# Patient Record
Sex: Female | Born: 1965 | Race: Asian | Hispanic: No | Marital: Married
Health system: Southern US, Community
[De-identification: ages and names within clinical notes are randomized; demographics above are authoritative.]

---

## 2003-06-02 ENCOUNTER — Other Ambulatory Visit: Admission: RE | Admit: 2003-06-02 | Discharge: 2003-06-02 | Payer: Self-pay | Admitting: Obstetrics and Gynecology

## 2003-11-24 ENCOUNTER — Inpatient Hospital Stay (HOSPITAL_COMMUNITY): Admission: AD | Admit: 2003-11-24 | Discharge: 2003-11-30 | Payer: Self-pay | Admitting: Obstetrics and Gynecology

## 2004-06-21 ENCOUNTER — Other Ambulatory Visit: Admission: RE | Admit: 2004-06-21 | Discharge: 2004-06-21 | Payer: Self-pay | Admitting: Obstetrics & Gynecology

## 2004-12-14 ENCOUNTER — Other Ambulatory Visit: Admission: RE | Admit: 2004-12-14 | Discharge: 2004-12-14 | Payer: Self-pay | Admitting: Obstetrics & Gynecology

## 2005-07-21 ENCOUNTER — Other Ambulatory Visit: Admission: RE | Admit: 2005-07-21 | Discharge: 2005-07-21 | Payer: Self-pay | Admitting: Obstetrics & Gynecology

## 2010-09-27 ENCOUNTER — Other Ambulatory Visit: Payer: Self-pay | Admitting: Obstetrics & Gynecology

## 2012-10-15 ENCOUNTER — Other Ambulatory Visit: Payer: Self-pay | Admitting: Obstetrics & Gynecology

## 2013-10-21 ENCOUNTER — Other Ambulatory Visit: Payer: Self-pay | Admitting: Obstetrics & Gynecology

## 2015-10-19 ENCOUNTER — Other Ambulatory Visit: Payer: Self-pay | Admitting: Obstetrics & Gynecology

## 2015-10-20 LAB — CYTOLOGY - PAP

## 2016-10-18 ENCOUNTER — Other Ambulatory Visit: Payer: Self-pay | Admitting: Obstetrics & Gynecology

## 2016-10-18 DIAGNOSIS — R928 Other abnormal and inconclusive findings on diagnostic imaging of breast: Secondary | ICD-10-CM

## 2016-10-31 ENCOUNTER — Ambulatory Visit
Admission: RE | Admit: 2016-10-31 | Discharge: 2016-10-31 | Disposition: A | Discharge status: Home / Self Care | Payer: BLUE CROSS/BLUE SHIELD | Source: Ambulatory Visit | Attending: Obstetrics & Gynecology | Admitting: Obstetrics & Gynecology

## 2016-10-31 DIAGNOSIS — R928 Other abnormal and inconclusive findings on diagnostic imaging of breast: Secondary | ICD-10-CM

## 2017-10-03 ENCOUNTER — Other Ambulatory Visit: Payer: Self-pay | Admitting: Obstetrics & Gynecology

## 2017-10-03 DIAGNOSIS — Z1231 Encounter for screening mammogram for malignant neoplasm of breast: Secondary | ICD-10-CM

## 2017-10-30 ENCOUNTER — Encounter: Payer: Self-pay | Admitting: Radiology

## 2017-10-30 ENCOUNTER — Ambulatory Visit
Admission: RE | Admit: 2017-10-30 | Discharge: 2017-10-30 | Disposition: A | Discharge status: Home / Self Care | Payer: BLUE CROSS/BLUE SHIELD | Source: Ambulatory Visit | Attending: Obstetrics & Gynecology | Admitting: Obstetrics & Gynecology

## 2017-10-30 DIAGNOSIS — Z1231 Encounter for screening mammogram for malignant neoplasm of breast: Secondary | ICD-10-CM

## 2018-09-18 ENCOUNTER — Other Ambulatory Visit: Payer: Self-pay | Admitting: Obstetrics & Gynecology

## 2018-09-18 DIAGNOSIS — Z1231 Encounter for screening mammogram for malignant neoplasm of breast: Secondary | ICD-10-CM

## 2018-11-05 ENCOUNTER — Ambulatory Visit: Payer: BLUE CROSS/BLUE SHIELD

## 2019-09-27 ENCOUNTER — Ambulatory Visit: Payer: Self-pay

## 2019-10-09 ENCOUNTER — Other Ambulatory Visit: Payer: Self-pay | Admitting: Obstetrics & Gynecology

## 2019-10-09 DIAGNOSIS — Z1231 Encounter for screening mammogram for malignant neoplasm of breast: Secondary | ICD-10-CM

## 2019-10-14 ENCOUNTER — Ambulatory Visit: Payer: Self-pay | Attending: Internal Medicine

## 2019-10-14 DIAGNOSIS — Z23 Encounter for immunization: Secondary | ICD-10-CM

## 2019-10-14 NOTE — Progress Notes (Signed)
   Covid-19 Vaccination Clinic  Name:  Katie Ramsey    MRN: [id number] DOB: Mar 30, 1966  10/14/2019  [name] was observed post Covid-19 immunization for 15 minutes without incident. She was provided with Vaccine Information Sheet and instruction to access the V-Safe system.   [name] was instructed to call 911 with any severe reactions post vaccine: Marland Kitchen Difficulty breathing  . Swelling of face and throat  . A fast heartbeat  . A bad rash all over body  . Dizziness and weakness   Immunizations Administered    Name Date Dose VIS Date Route   Pfizer COVID-19 Vaccine 10/14/2019 11:27 AM 0.3 mL 07/05/2019 Intramuscular   Manufacturer: [employer], [employer]   Lot: [id number]   NDC: [id number]

## 2019-10-28 IMAGING — MG DIGITAL SCREENING BILATERAL MAMMOGRAM WITH TOMO AND CAD
8 series · 9 of 24 positions shown · non-contrast
Comparison: Previous exam(s).

CLINICAL DATA: Screening.

EXAM:
DIGITAL SCREENING BILATERAL MAMMOGRAM WITH TOMO AND CAD

[L MLO synth-2D]
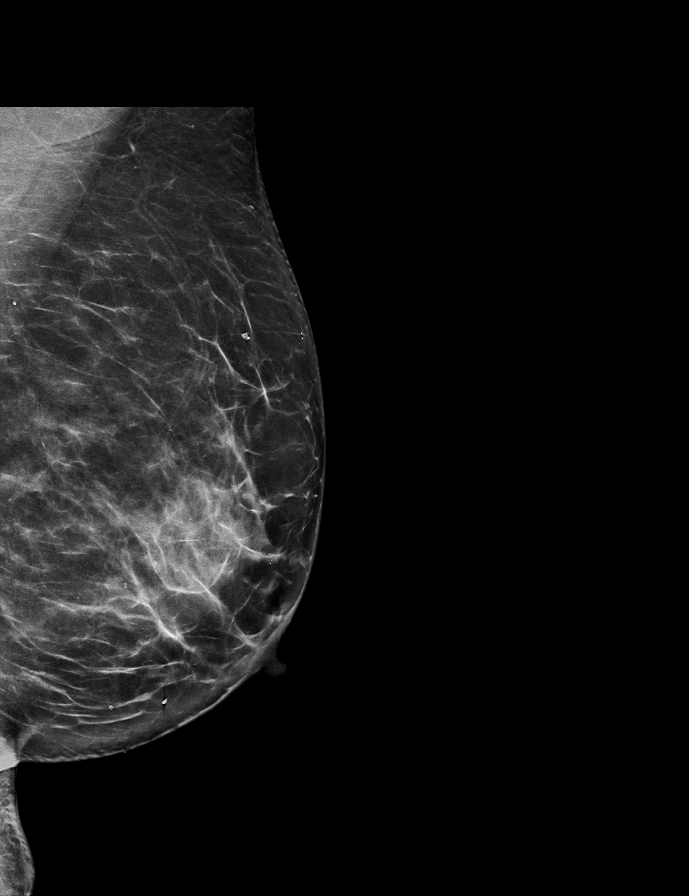

[R MLO synth-2D]
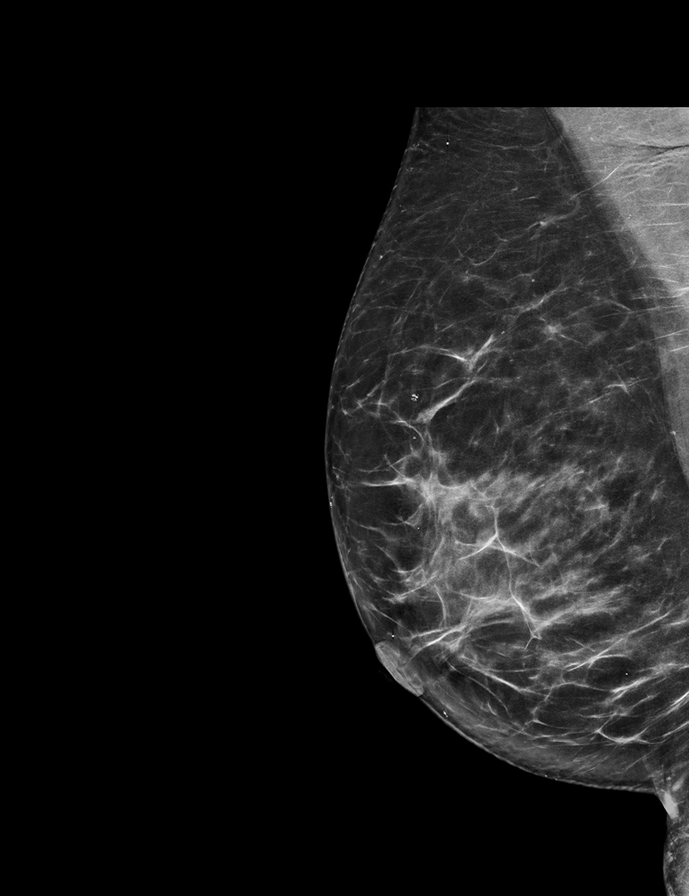

[R CC synth-2D]
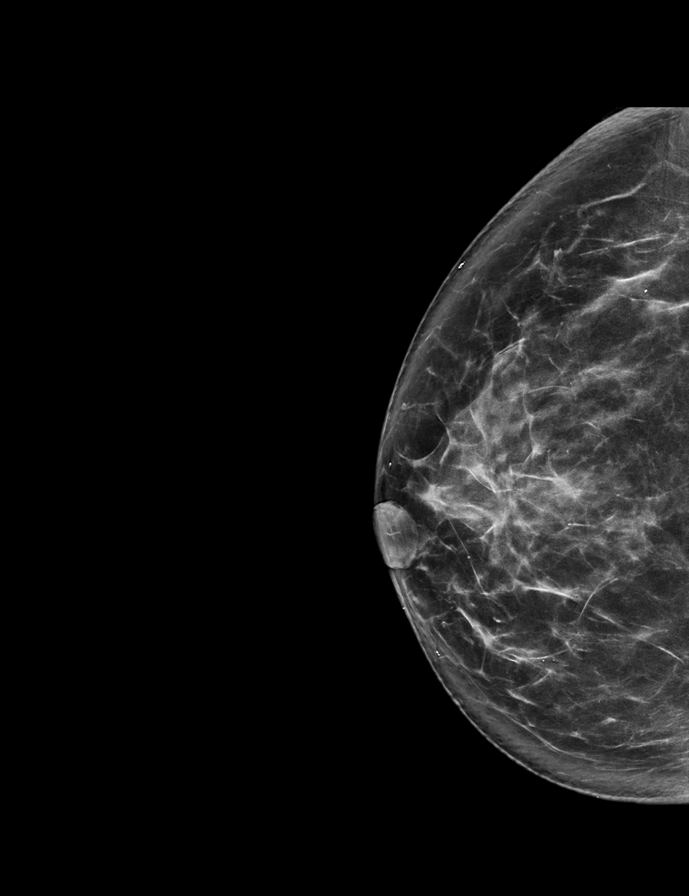

[L CC synth-2D]
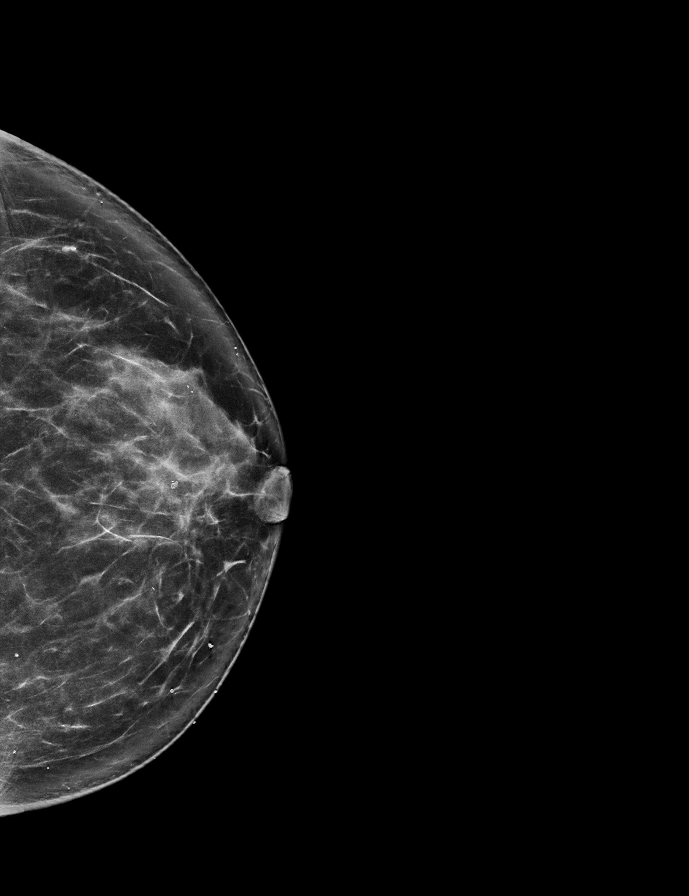

[L MLO tomo · 2 of 73 frames shown]
[frame 24/73]
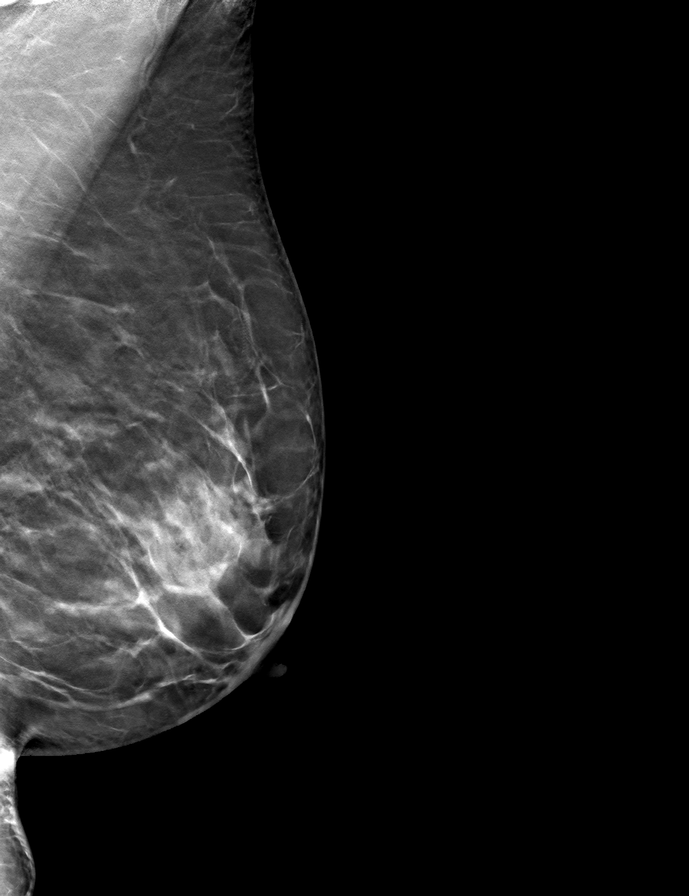
[frame 37/73]
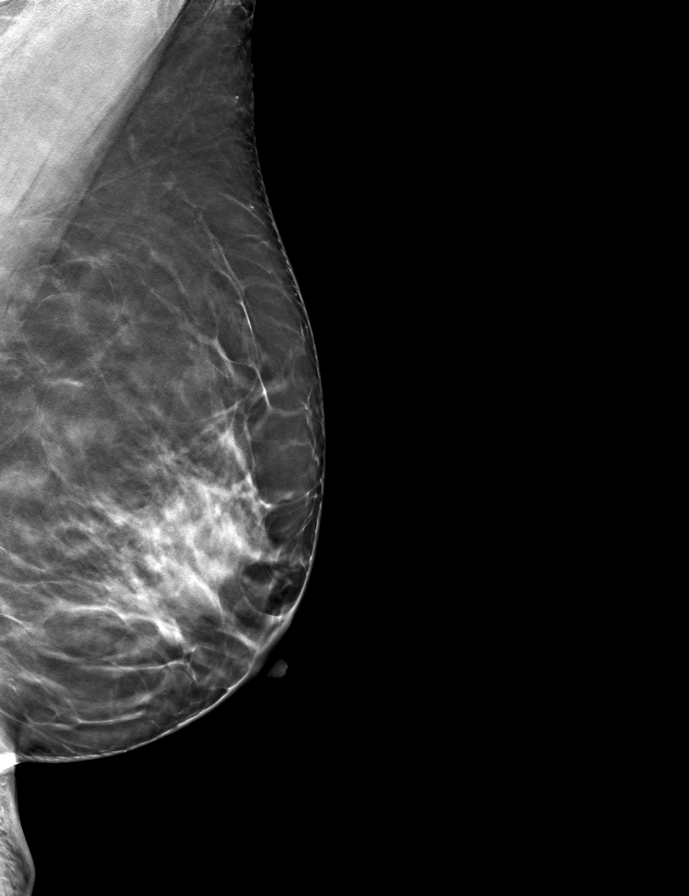

[R MLO tomo · tomo slice 38/75.0]
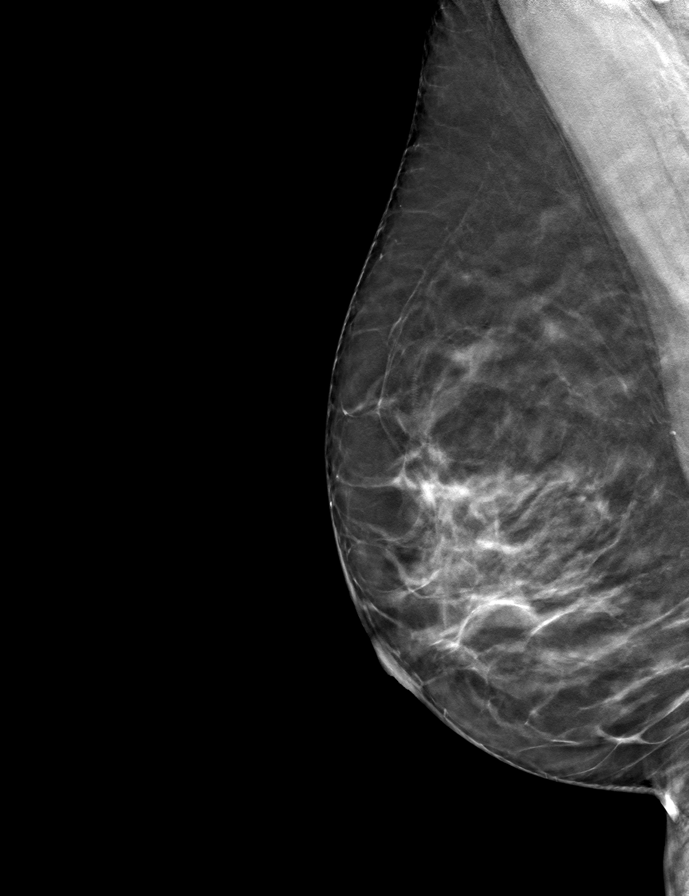

[R CC tomo · tomo slice 39/76.0]
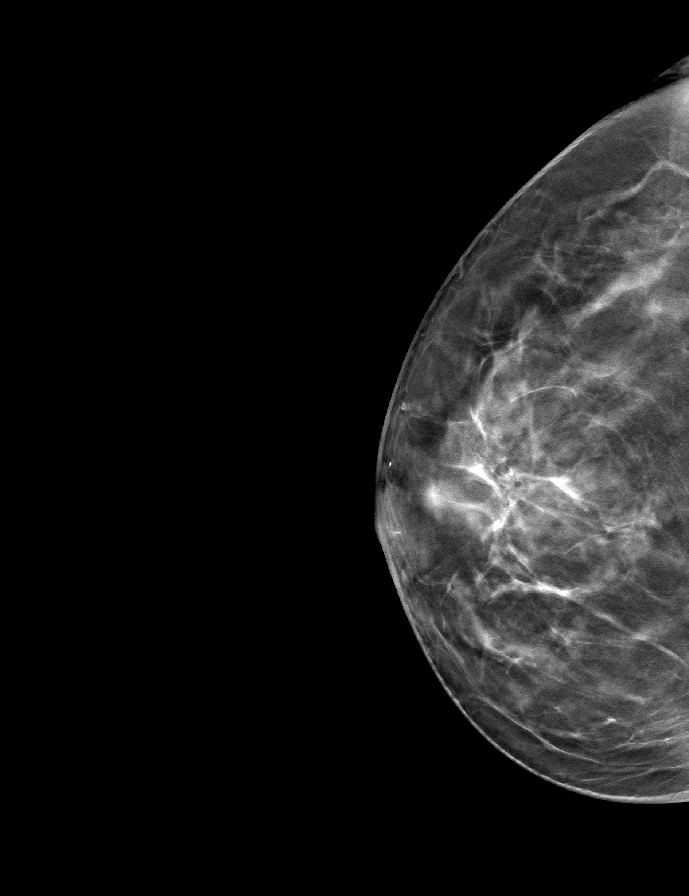

[L CC tomo · tomo slice 37/72.0]
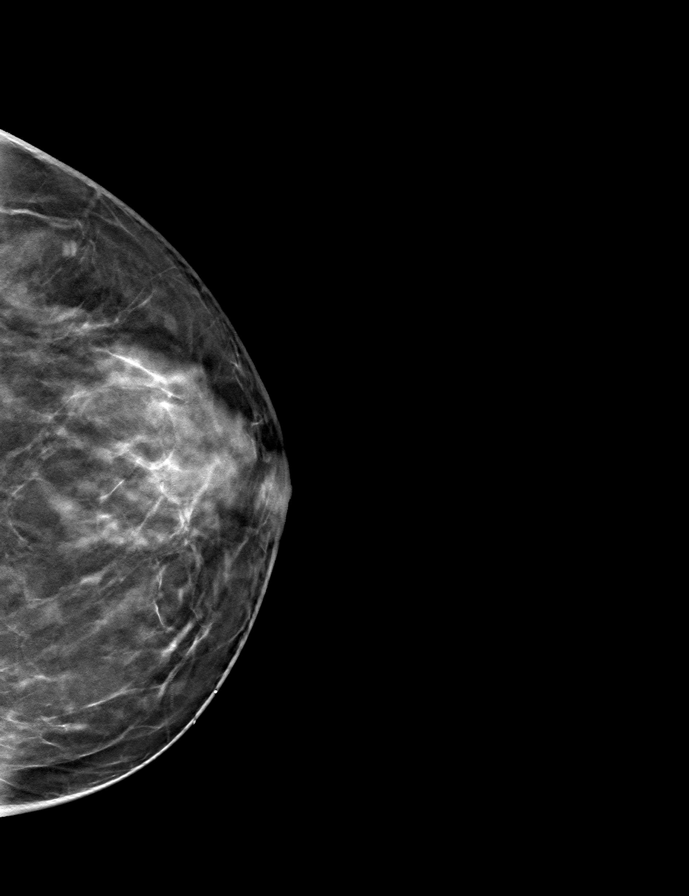

[9 of 24 positions shown; findings below may reference images not displayed]

ACR Breast Density Category c: The breast tissue is heterogeneously
dense, which may obscure small masses.
FINDINGS: There are no findings suspicious for malignancy. Images were
processed with CAD.
IMPRESSION: No mammographic evidence of malignancy. A result letter of this
screening mammogram will be mailed directly to the patient.

RECOMMENDATION:
Screening mammogram in one year. (Code:[id number])

BI-RADS CATEGORY  1: Negative.

## 2019-11-11 ENCOUNTER — Ambulatory Visit: Payer: Self-pay | Attending: Internal Medicine

## 2019-11-11 DIAGNOSIS — Z23 Encounter for immunization: Secondary | ICD-10-CM

## 2019-11-11 NOTE — Progress Notes (Signed)
   Covid-19 Vaccination Clinic  Name:  Katie Ramsey    MRN: [id number] DOB: 09-02-1965  11/11/2019  [name] was observed post Covid-19 immunization for 15 minutes without incident. She was provided with Vaccine Information Sheet and instruction to access the V-Safe system.   [name] was instructed to call 911 with any severe reactions post vaccine: Marland Kitchen Difficulty breathing  . Swelling of face and throat  . A fast heartbeat  . A bad rash all over body  . Dizziness and weakness   Immunizations Administered    Name Date Dose VIS Date Route   Pfizer COVID-19 Vaccine 11/11/2019 10:41 AM 0.3 mL 09/18/2018 Intramuscular   Manufacturer: [employer], [employer]   Lot: [id number]   NDC: [address]-7

## 2021-11-15 DIAGNOSIS — Z8742 Personal history of other diseases of the female genital tract: Secondary | ICD-10-CM | POA: Diagnosis not present

## 2021-11-15 DIAGNOSIS — E7800 Pure hypercholesterolemia, unspecified: Secondary | ICD-10-CM | POA: Diagnosis not present

## 2021-11-15 DIAGNOSIS — Z0000 Encounter for general adult medical examination without abnormal findings: Secondary | ICD-10-CM | POA: Diagnosis not present

## 2022-04-04 DIAGNOSIS — R7303 Prediabetes: Secondary | ICD-10-CM | POA: Diagnosis not present

## 2022-11-25 DIAGNOSIS — Z1231 Encounter for screening mammogram for malignant neoplasm of breast: Secondary | ICD-10-CM | POA: Diagnosis not present

## 2022-11-25 DIAGNOSIS — Z0000 Encounter for general adult medical examination without abnormal findings: Secondary | ICD-10-CM | POA: Diagnosis not present

## 2022-11-25 DIAGNOSIS — E7800 Pure hypercholesterolemia, unspecified: Secondary | ICD-10-CM | POA: Diagnosis not present

## 2022-11-25 DIAGNOSIS — R7303 Prediabetes: Secondary | ICD-10-CM | POA: Diagnosis not present

## 2022-11-25 DIAGNOSIS — Z01419 Encounter for gynecological examination (general) (routine) without abnormal findings: Secondary | ICD-10-CM | POA: Diagnosis not present

## 2022-11-25 DIAGNOSIS — Z6826 Body mass index (BMI) 26.0-26.9, adult: Secondary | ICD-10-CM | POA: Diagnosis not present

## 2023-04-25 DIAGNOSIS — R7303 Prediabetes: Secondary | ICD-10-CM | POA: Diagnosis not present

## 2023-11-27 DIAGNOSIS — Z0000 Encounter for general adult medical examination without abnormal findings: Secondary | ICD-10-CM | POA: Diagnosis not present

## 2023-11-27 DIAGNOSIS — Z780 Asymptomatic menopausal state: Secondary | ICD-10-CM | POA: Diagnosis not present

## 2023-11-27 DIAGNOSIS — Z01419 Encounter for gynecological examination (general) (routine) without abnormal findings: Secondary | ICD-10-CM | POA: Diagnosis not present

## 2023-11-27 DIAGNOSIS — E663 Overweight: Secondary | ICD-10-CM | POA: Diagnosis not present

## 2023-11-27 DIAGNOSIS — Z6828 Body mass index (BMI) 28.0-28.9, adult: Secondary | ICD-10-CM | POA: Diagnosis not present

## 2023-11-27 DIAGNOSIS — R7303 Prediabetes: Secondary | ICD-10-CM | POA: Diagnosis not present

## 2023-11-27 DIAGNOSIS — E7800 Pure hypercholesterolemia, unspecified: Secondary | ICD-10-CM | POA: Diagnosis not present

## 2023-11-27 DIAGNOSIS — Z1231 Encounter for screening mammogram for malignant neoplasm of breast: Secondary | ICD-10-CM | POA: Diagnosis not present
